# Patient Record
Sex: Female | Born: 1990 | Race: White | Hispanic: No | Marital: Married | Smoking: Never smoker
Health system: Southern US, Community
[De-identification: ages and names within clinical notes are randomized; demographics above are authoritative.]

## PROBLEM LIST (undated history)

## (undated) DIAGNOSIS — R55 Syncope and collapse: Secondary | ICD-10-CM

## (undated) HISTORY — DX: Syncope and collapse: R55

---

## 2016-11-15 ENCOUNTER — Emergency Department (HOSPITAL_BASED_OUTPATIENT_CLINIC_OR_DEPARTMENT_OTHER): Payer: Worker's Compensation

## 2016-11-15 ENCOUNTER — Emergency Department (HOSPITAL_BASED_OUTPATIENT_CLINIC_OR_DEPARTMENT_OTHER)
Admission: EM | Admit: 2016-11-15 | Discharge: 2016-11-15 | Disposition: A | Discharge status: Home / Self Care | Payer: Worker's Compensation | Attending: Emergency Medicine | Admitting: Emergency Medicine

## 2016-11-15 ENCOUNTER — Encounter (HOSPITAL_BASED_OUTPATIENT_CLINIC_OR_DEPARTMENT_OTHER): Payer: Self-pay | Admitting: Emergency Medicine

## 2016-11-15 DIAGNOSIS — S93401A Sprain of unspecified ligament of right ankle, initial encounter: Secondary | ICD-10-CM

## 2016-11-15 DIAGNOSIS — Y9301 Activity, walking, marching and hiking: Secondary | ICD-10-CM | POA: Insufficient documentation

## 2016-11-15 DIAGNOSIS — M25571 Pain in right ankle and joints of right foot: Secondary | ICD-10-CM

## 2016-11-15 DIAGNOSIS — X58XXXA Exposure to other specified factors, initial encounter: Secondary | ICD-10-CM | POA: Diagnosis not present

## 2016-11-15 DIAGNOSIS — Y929 Unspecified place or not applicable: Secondary | ICD-10-CM | POA: Diagnosis not present

## 2016-11-15 DIAGNOSIS — M79671 Pain in right foot: Secondary | ICD-10-CM

## 2016-11-15 DIAGNOSIS — Y999 Unspecified external cause status: Secondary | ICD-10-CM | POA: Diagnosis not present

## 2016-11-15 NOTE — Discharge Instructions (Signed)
Please take Ibuprofen (Advil, motrin) and Tylenol (acetaminophen) to relieve your pain.  You may take up to 800 MG (4 pills) of normal strength ibuprofen every 8 hours as needed.  In between doses of ibuprofen you make take tylenol, up to 1,000 mg (two extra strength pills).  Do not take more than 4,000 mg tylenol in a 24 hour period.  Please check all medication labels as many medications such as pain and cold medications may contain tylenol.  Do not drink alcohol while taking these medications.  Do not take other NSAID'S while taking ibuprofen (such as aleve or naproxen).  Please take ibuprofen with food to decrease stomach upset.

## 2016-11-15 NOTE — ED Provider Notes (Signed)
[facility] Provider Note   CSN: [id number] Arrival date & time: 11/15/16  1554  By signing my name below, I, Modena Jansky, attest that this documentation has been prepared under the direction and in the presence of non-physician practitioner, Lyndel Safe, PA-C. Electronically Signed: Modena Jansky, Scribe. 11/15/2016. 4:51 PM.  History   Chief Complaint Chief Complaint  Patient presents with  . Foot Pain   The history is provided by the patient. No language interpreter was used.   HPI Comments: Geneen Dieter is a 26 y.o. female who presents to the Emergency Department complaining of constant moderate right foot pain that started yesterday. She had a sudden onset of pain after stepping in a hole and everting her right foot. She elevated, iced, and applied compression to her foot PTA with some relief. Her pain is exacerbated by ambulation. She reports associated swelling. Denies any other complaints at this time.   No PCP  History reviewed. No pertinent past medical history.  There are no active problems to display for this patient.   Past Surgical History:  Procedure Laterality Date  . CESAREAN SECTION      OB History    No data available       Home Medications    Prior to Admission medications   Not on File    Family History No family history on file.  Social History Social History  Substance Use Topics  . Smoking status: Never Smoker  . Smokeless tobacco: Never Used  . Alcohol use No     Allergies   Patient has no known allergies.   Review of Systems Review of Systems  Musculoskeletal: Positive for joint swelling and myalgias.  Neurological: Negative for numbness.     Physical Exam Updated Vital Signs BP 95/76 (BP Location: Right Arm)   Pulse 76   Temp 98 F (36.7 C) (Oral)   Resp 16   Ht 5\' 10"  (1.778 m)   Wt 213 lb (96.6 kg)   SpO2 97%   BMI 30.56 kg/m   Physical Exam  Constitutional: She appears well-developed and  well-nourished. No distress.  HENT:  Head: Normocephalic.  Pulmonary/Chest: Effort normal.  Musculoskeletal:  Obvious swelling to the right lateral foot/ankle. No TTP of lateral or medial malleolus. No TTP of the head of the 5th metatarsal. Ankle is grossly stable with intact sensation, motor, and circulatory function.   Neurological: She is alert.  Psychiatric: She has a normal mood and affect.  Nursing note and vitals reviewed.    ED Treatments / Results  DIAGNOSTIC STUDIES: Oxygen Saturation is 97% on RA, normal by my interpretation.    COORDINATION OF CARE: 4:55 PM- Pt advised of plan for treatment and pt agrees.  Labs (all labs ordered are listed, but only abnormal results are displayed) Labs Reviewed - No data to display  EKG  EKG Interpretation None       Radiology Dg Foot Complete Right  Result Date: 11/15/2016 CLINICAL DATA:  Twisting injury with pain and swelling EXAM: RIGHT FOOT COMPLETE - 3+ VIEW COMPARISON:  None. FINDINGS: Frontal, oblique, and lateral views were obtained. There is no fracture or dislocation. Joint spaces appear normal. No erosive change. IMPRESSION: No fracture or dislocation.  No appreciable arthropathic change. Electronically Signed   By: Bretta Bang III M.D.   On: 11/15/2016 16:40    Procedures Procedures (including critical care time)  Medications Ordered in ED Medications - No data to display   Initial Impression / Assessment and  Plan / ED Course  I have reviewed the triage vital signs and the nursing notes.  Pertinent labs & imaging results that were available during my care of the patient were reviewed by me and considered in my medical decision making (see chart for details).     Patient X-Ray negative for obvious fracture or dislocation. Pain managed in ED. Pt advised to follow up with podiatry if symptoms persist for possibility of missed fracture diagnosis. Patient given Aircast, ace wrap while in ED, conservative  therapy recommended and discussed. Patient offered crutches but declined.  Patient will be dc home & is agreeable with above plan.  Final Clinical Impressions(s) / ED Diagnoses   Final diagnoses:  Sprain of right ankle, unspecified ligament, initial encounter  Right foot pain  Acute right ankle pain    New Prescriptions There are no discharge medications for this patient.  I personally performed the services described in this documentation, which was scribed in my presence. The recorded information has been reviewed and is accurate.    Cristina GongHammond, Jamelle Noy W, PA-C 11/16/16 1325    Shaune PollackIsaacs, Cameron, MD 11/16/16 1404

## 2016-11-15 NOTE — ED Triage Notes (Signed)
R foot pain after stepping in a hole yesterday. Swelling noted.

## 2018-04-29 ENCOUNTER — Emergency Department (HOSPITAL_BASED_OUTPATIENT_CLINIC_OR_DEPARTMENT_OTHER)
Admission: EM | Admit: 2018-04-29 | Discharge: 2018-04-29 | Disposition: A | Discharge status: Home / Self Care | Payer: BC Managed Care – PPO | Attending: Emergency Medicine | Admitting: Emergency Medicine

## 2018-04-29 ENCOUNTER — Encounter (HOSPITAL_BASED_OUTPATIENT_CLINIC_OR_DEPARTMENT_OTHER): Payer: Self-pay | Admitting: *Deleted

## 2018-04-29 ENCOUNTER — Other Ambulatory Visit: Payer: Self-pay

## 2018-04-29 ENCOUNTER — Emergency Department (HOSPITAL_BASED_OUTPATIENT_CLINIC_OR_DEPARTMENT_OTHER): Payer: BC Managed Care – PPO

## 2018-04-29 DIAGNOSIS — R55 Syncope and collapse: Secondary | ICD-10-CM | POA: Diagnosis present

## 2018-04-29 LAB — URINALYSIS, MICROSCOPIC (REFLEX)

## 2018-04-29 LAB — CBC WITH DIFFERENTIAL/PLATELET
Abs Immature Granulocytes: 0.05 10*3/uL (ref 0.00–0.07)
Basophils Absolute: 0.1 10*3/uL (ref 0.0–0.1)
Basophils Relative: 0 %
EOS ABS: 0.2 10*3/uL (ref 0.0–0.5)
Eosinophils Relative: 1 %
HCT: 43 % (ref 36.0–46.0)
Hemoglobin: 13.7 g/dL (ref 12.0–15.0)
Immature Granulocytes: 0 %
Lymphocytes Relative: 16 %
Lymphs Abs: 1.9 10*3/uL (ref 0.7–4.0)
MCH: 28.5 pg (ref 26.0–34.0)
MCHC: 31.9 g/dL (ref 30.0–36.0)
MCV: 89.4 fL (ref 80.0–100.0)
MONO ABS: 0.7 10*3/uL (ref 0.1–1.0)
MONOS PCT: 6 %
Neutro Abs: 9.2 10*3/uL — ABNORMAL HIGH (ref 1.7–7.7)
Neutrophils Relative %: 77 %
PLATELETS: 267 10*3/uL (ref 150–400)
RBC: 4.81 MIL/uL (ref 3.87–5.11)
RDW: 11.8 % (ref 11.5–15.5)
WBC: 12.1 10*3/uL — ABNORMAL HIGH (ref 4.0–10.5)
nRBC: 0 % (ref 0.0–0.2)

## 2018-04-29 LAB — URINALYSIS, ROUTINE W REFLEX MICROSCOPIC
BILIRUBIN URINE: NEGATIVE
Glucose, UA: NEGATIVE mg/dL
Ketones, ur: NEGATIVE mg/dL
Leukocytes, UA: NEGATIVE
NITRITE: NEGATIVE
PROTEIN: NEGATIVE mg/dL
Specific Gravity, Urine: >1.03 — ABNORMAL HIGH (ref 1.005–1.030)
pH: 5 (ref 5.0–8.0)

## 2018-04-29 LAB — BASIC METABOLIC PANEL
Anion gap: 7 (ref 5–15)
BUN: 23 mg/dL — AB (ref 6–20)
CALCIUM: 8.9 mg/dL (ref 8.9–10.3)
CO2: 23 mmol/L (ref 22–32)
CREATININE: 0.78 mg/dL (ref 0.44–1.00)
Chloride: 106 mmol/L (ref 98–111)
GFR calc Af Amer: >60 mL/min (ref >60)
Glucose, Bld: 107 mg/dL — ABNORMAL HIGH (ref 70–99)
Potassium: 3.5 mmol/L (ref 3.5–5.1)
Sodium: 136 mmol/L (ref 135–145)

## 2018-04-29 LAB — PREGNANCY, URINE: Preg Test, Ur: NEGATIVE

## 2018-04-29 LAB — CBG MONITORING, ED: Glucose-Capillary: 98 mg/dL (ref 70–99)

## 2018-04-29 MED ORDER — SODIUM CHLORIDE 0.9 % IV BOLUS
1000.0000 mL | Freq: Once | INTRAVENOUS | Status: AC
  Administered 2018-04-29: 1000 mL via INTRAVENOUS

## 2018-04-29 NOTE — ED Provider Notes (Signed)
[facility] Provider Note: Lowella Dell, MD, FACEP  CSN: [id number] MRN: [id number] ARRIVAL: 04/29/18 at 0038 ROOM: [facility]   CHIEF COMPLAINT  Syncope   HISTORY OF PRESENT ILLNESS  04/29/18 1:00 AM Natalie Hardin is a 27 y.o. female without significant past medical history.  She was bundled up at home because she felt cold.  While tending to a sick daughter she felt nauseated but had no other symptoms.  Her husband observed her slumped to the bed, slide off the bed onto the floor and then had generalized shaking for 5 to 10 seconds ("it was very brief").  There was no urinary or fecal incontinence.  She did not bite her tongue.  When she came to she was confused for several minutes, then returned to baseline.  She denies injury.  She is asymptomatic presently.  She is not taking any prescription, over-the-counter, or illicit drugs.   History reviewed. No pertinent past medical history.  Past Surgical History:  Procedure Laterality Date  . CESAREAN SECTION      No family history on file.  Social History   Tobacco Use  . Smoking status: Never Smoker  . Smokeless tobacco: Never Used  Substance Use Topics  . Alcohol use: No  . Drug use: No    Prior to Admission medications   Not on File    Allergies Patient has no known allergies.   REVIEW OF SYSTEMS  Negative except as noted here or in the History of Present Illness.   PHYSICAL EXAMINATION  Initial Vital Signs Blood pressure (!) 142/86, pulse 80, temperature 97.8 F (36.6 C), temperature source Oral, resp. rate 16, height 5\' 9"  (1.753 m), weight 95.3 kg, SpO2 100 %.  Examination General: Well-developed, well-nourished female in no acute distress; appearance consistent with age of record HENT: normocephalic; atraumatic; no bite marks on tongue Eyes: pupils equal, round and reactive to light; extraocular muscles intact Neck: supple Heart: regular rate and rhythm Lungs: clear to auscultation  bilaterally Abdomen: soft; nondistended; nontender; no masses or hepatosplenomegaly; bowel sounds present Extremities: No deformity; full range of motion; pulses normal Neurologic: Awake, alert and oriented; motor function intact in all extremities and symmetric; no facial droop; negative Romberg; normal finger-to-nose; normal coordination and speech Skin: Warm and dry Psychiatric: Normal mood and affect   RESULTS  Summary of this visit's results, reviewed by myself:   EKG Interpretation  Date/Time:  Sunday April 29 2018 01:01:01 EST Ventricular Rate:  78 PR Interval:    QRS Duration: 109 QT Interval:  376 QTC Calculation: 429 R Axis:   59 Text Interpretation:  Sinus rhythm RSR' in V1 or V2, right VCD or RVH Borderline T abnormalities, anterior leads Baseline wander in lead(s) V6 No previous ECGs available Confirmed by Paula Libra ([id number]) on 04/29/2018 1:10:38 AM      Laboratory Studies: Results for orders placed or performed during the hospital encounter of 04/29/18 (from the past 24 hour(s))  CBG monitoring, ED     Status: None   Collection Time: 04/29/18  1:11 AM  Result Value Ref Range   Glucose-Capillary 98 70 - 99 mg/dL  CBC with Differential/Platelet     Status: Abnormal   Collection Time: 04/29/18  1:43 AM  Result Value Ref Range   WBC 12.1 (H) 4.0 - 10.5 K/uL   RBC 4.81 3.87 - 5.11 MIL/uL   Hemoglobin 13.7 12.0 - 15.0 g/dL   HCT 62.1 30.8 - 65.7 %   MCV 89.4 80.0 - 100.0  fL   MCH 28.5 26.0 - 34.0 pg   MCHC 31.9 30.0 - 36.0 g/dL   RDW 16.1 09.6 - 04.5 %   Platelets 267 150 - 400 K/uL   nRBC 0.0 0.0 - 0.2 %   Neutrophils Relative % 77 %   Neutro Abs 9.2 (H) 1.7 - 7.7 K/uL   Lymphocytes Relative 16 %   Lymphs Abs 1.9 0.7 - 4.0 K/uL   Monocytes Relative 6 %   Monocytes Absolute 0.7 0.1 - 1.0 K/uL   Eosinophils Relative 1 %   Eosinophils Absolute 0.2 0.0 - 0.5 K/uL   Basophils Relative 0 %   Basophils Absolute 0.1 0.0 - 0.1 K/uL   Immature Granulocytes 0 %    Abs Immature Granulocytes 0.05 0.00 - 0.07 K/uL  Basic metabolic panel     Status: Abnormal   Collection Time: 04/29/18  1:43 AM  Result Value Ref Range   Sodium 136 135 - 145 mmol/L   Potassium 3.5 3.5 - 5.1 mmol/L   Chloride 106 98 - 111 mmol/L   CO2 23 22 - 32 mmol/L   Glucose, Bld 107 (H) 70 - 99 mg/dL   BUN 23 (H) 6 - 20 mg/dL   Creatinine, Ser 4.09 0.44 - 1.00 mg/dL   Calcium 8.9 8.9 - 81.1 mg/dL   GFR calc non Af Amer >60 >60 mL/min   GFR calc Af Amer >60 >60 mL/min   Anion gap 7 5 - 15  Urinalysis, Routine w reflex microscopic     Status: Abnormal   Collection Time: 04/29/18  1:54 AM  Result Value Ref Range   Color, Urine YELLOW YELLOW   APPearance CLEAR CLEAR   Specific Gravity, Urine >1.030 (H) 1.005 - 1.030   pH 5.0 5.0 - 8.0   Glucose, UA NEGATIVE NEGATIVE mg/dL   Hgb urine dipstick SMALL (A) NEGATIVE   Bilirubin Urine NEGATIVE NEGATIVE   Ketones, ur NEGATIVE NEGATIVE mg/dL   Protein, ur NEGATIVE NEGATIVE mg/dL   Nitrite NEGATIVE NEGATIVE   Leukocytes, UA NEGATIVE NEGATIVE  Pregnancy, urine     Status: None   Collection Time: 04/29/18  1:54 AM  Result Value Ref Range   Preg Test, Ur NEGATIVE NEGATIVE  Urinalysis, Microscopic (reflex)     Status: Abnormal   Collection Time: 04/29/18  1:54 AM  Result Value Ref Range   RBC / HPF 6-10 0 - 5 RBC/hpf   WBC, UA 0-5 0 - 5 WBC/hpf   Bacteria, UA FEW (A) NONE SEEN   Squamous Epithelial / LPF 0-5 0 - 5   Mucus PRESENT    Imaging Studies: Ct Head Wo Contrast  Result Date: 04/29/2018 CLINICAL DATA:  New nontraumatic seizure. EXAM: CT HEAD WITHOUT CONTRAST TECHNIQUE: Contiguous axial images were obtained from the base of the skull through the vertex without intravenous contrast. COMPARISON:  None. FINDINGS: Brain: No evidence of acute infarction, hemorrhage, hydrocephalus, extra-axial collection or mass lesion/mass effect. Vascular: No hyperdense vessel or unexpected calcification. Skull: Calvarium appears intact.  Sinuses/Orbits: Mild mucosal thickening in the paranasal sinuses. No acute air-fluid levels. Mastoid air cells are clear. Other: None. IMPRESSION: No acute intracranial abnormalities. Electronically Signed   By: Burman Nieves M.D.   On: 04/29/2018 01:39    ED COURSE and MDM  Nursing notes and initial vitals signs, including pulse oximetry, reviewed.  Vitals:   04/29/18 0134 04/29/18 0200 04/29/18 0230 04/29/18 0245  BP: 120/75 109/81 113/76 105/70  Pulse: 73 69 73 67  Resp:  Temp:      TempSrc:      SpO2: 99% 99% 100% 98%  Weight:      Height:       3:00 AM Patient given normal saline bolus 1 L as urinalysis specific gravity suggests dehydration.  It is unclear if this represents simple syncope with seizure-like activity or an actual seizure.  Patient was advised not to drive or operate machinery pending follow-up with neurology.  We will refer to neurology for further work-up.  She does not have a primary care physician.  PROCEDURES    ED DIAGNOSES     ICD-10-CM   1. Brief loss of consciousness R55        Delmos Velaquez, MD 04/29/18 0301

## 2018-04-29 NOTE — ED Triage Notes (Signed)
Pt's husband reports pt had a 5-10 second episode of shaking following a brief LOC around 1130pm. States she was standing at end of bed then slumped over and slid onto floor and convulsed briefly. Reports pt was confused for a few minutes after the episode. Denies Hx of seizures. Pt cao x 4 at present

## 2018-05-07 ENCOUNTER — Ambulatory Visit: Payer: BC Managed Care – PPO | Admitting: Neurology

## 2018-05-07 ENCOUNTER — Encounter: Payer: Self-pay | Admitting: Neurology

## 2018-05-07 VITALS — BP 149/97 | HR 76 | Ht 69.0 in | Wt 218.0 lb

## 2018-05-07 DIAGNOSIS — R252 Cramp and spasm: Secondary | ICD-10-CM

## 2018-05-07 DIAGNOSIS — R55 Syncope and collapse: Secondary | ICD-10-CM | POA: Diagnosis not present

## 2018-05-07 NOTE — Progress Notes (Signed)
[facility] [address] [place]. [place] [address] [phone]       OFFICE CONSULT NOTE  Ms. Natalie Hardin Date of Birth:  Apr 27, 1991 Medical Record Number:  [id number]   Referring MD: Paula Libra Reason for Referral: Syncope versus seizure  HPI: Natalie Hardin is a pleasant 27 year old Caucasian lady seen today for initial office consultation visit.  She is accompanied by her husband.  History is obtained from them and review of electronic medical records.  I personally reviewed imaging films.  The patient woke up from sleep on 04/29/2018 as her daughter was sick and was throwing up.  She stood up to get out of bed and slumped forward face down and split to the edge and fell on the ground.  She regained consciousness only after she was on the ground and remained dazed and disoriented for a few minutes.  The husband was eyewitness noticed that her eyes were closed.  There is no tongue bite urinary incontinence noted.  She did have some minor jerking and trembling of her upper extremities which lasted 3 to 4 minutes only.  Patient was back to her normal self for than 5 minutes.  She denies any prior history of syncope or passing out but does admit to episodes where she has felt lightheaded and faint and needed to sit down and was sweaty but did not completely pass out.  She does have a history of underlying anxiety.  She underwent CT scan of the head in the emergency room which was unremarkable and I have personally reviewed imaging films.  She was felt to be slightly dehydrated.  She has no family history of seizures.  She is otherwise quite healthy and does not take any significant medications.  She has an IUD but does not take any birth control pills.  She denies history of migraine headaches seizures strokes or TIAs or significant head injury with loss of consciousness. ROS:   14 system review of systems is positive for fatigue, feeling hot and cold, increased thirst, headache,  passing out, anxiety, sleepiness and all systems negative PMH: History reviewed. No pertinent past medical history.  Social History:  Social History   Socioeconomic History  . Marital status: Married    Spouse name: Not on file  . Number of children: Not on file  . Years of education: Not on file  . Highest education level: Not on file  Occupational History  . Not on file  Social Needs  . Financial resource strain: Not on file  . Food insecurity:    Worry: Not on file    Inability: Not on file  . Transportation needs:    Medical: Not on file    Non-medical: Not on file  Tobacco Use  . Smoking status: Never Smoker  . Smokeless tobacco: Never Used  Substance and Sexual Activity  . Alcohol use: No  . Drug use: No  . Sexual activity: Yes    Birth control/protection: Inserts  Lifestyle  . Physical activity:    Days per week: Not on file    Minutes per session: Not on file  . Stress: Not on file  Relationships  . Social connections:    Talks on phone: Not on file    Gets together: Not on file    Attends religious service: Not on file    Active member of club or organization: Not on file    Attends meetings of clubs or organizations: Not on file    Relationship  status: Not on file  . Intimate partner violence:    Fear of current or ex partner: Not on file    Emotionally abused: Not on file    Physically abused: Not on file    Forced sexual activity: Not on file  Other Topics Concern  . Not on file  Social History Narrative  . Not on file    Medications:   Current Outpatient Medications on File Prior to Visit  Medication Sig Dispense Refill  . acetaminophen (TYLENOL) 325 MG suppository Place rectally.    Marland Kitchen. ibuprofen (ADVIL,MOTRIN) 600 MG tablet Take by mouth.    . levonorgestrel (MIRENA) 20 MCG/24HR IUD by Intrauterine route.     No current facility-administered medications on file prior to visit.     Allergies:  No Known Allergies  Physical Exam General:  Mildly obese young Caucasian lady, seated, in no evident distress Head: head normocephalic and atraumatic.   Neck: supple with no carotid or supraclavicular bruits Cardiovascular: regular rate and rhythm, no murmurs Musculoskeletal: no deformity Skin:  no rash/petichiae Vascular:  Normal pulses all extremities  Neurologic Exam Mental Status: Awake and fully alert. Oriented to place and time. Recent and remote memory intact. Attention span, concentration and fund of knowledge appropriate. Mood and affect appropriate.  Cranial Nerves: Fundoscopic exam reveals sharp disc margins. Pupils equal, briskly reactive to light. Extraocular movements full without nystagmus. Visual fields full to confrontation. Hearing intact. Facial sensation intact. Face, tongue, palate moves normally and symmetrically.  Motor: Normal bulk and tone. Normal strength in all tested extremity muscles. Sensory.: intact to touch , pinprick , position and vibratory sensation.  Coordination: Rapid alternating movements normal in all extremities. Finger-to-nose and heel-to-shin performed accurately bilaterally. Gait and Station: Arises from chair without difficulty. Stance is normal. Gait demonstrates normal stride length and balance . Able to heel, toe and tandem walk without difficulty.  Reflexes: 1+ and symmetric. Toes downgoing.      ASSESSMENT: 27 year old Caucasian lady with solitary episode of brief loss of consciousness followed by an mild jerking possibly convulsive syncope versus seizure.    PLAN: I had a long discussion with the patient and her husband regarding her episode of brief loss of consciousness and jerking likely represent representing convulsive syncope.  Seizure and vertebrobasilar TIA less likely but need evaluation for.  I recommend recheck EEG, transthoracic echo, 30-day heart monitor, MRI of the brain and MRA of the brain and neck.  She was advised to keep her self well-hydrated and not to drive until  about tests were completed.  Greater than 50% time during this 45-minute consultation visit was spent on counseling and coordination of care about syncope, seizure discussion about required testing and answered questions she will return for follow-up in 2 months or call earlier if necessary.  Delia HeadyPramod Sethi, MD  [facility] [address] [place], [place] [id number]  Phone [phone] Fax [phone] Note: This document was prepared with digital dictation and possible smart phrase technology. Any transcriptional errors that result from this process are unintentional.

## 2018-05-07 NOTE — Patient Instructions (Addendum)
I had a long discussion with the patient and her husband regarding her episode of brief loss of consciousness and jerking likely represent representing convulsive syncope.  Seizure and vertebrobasilar TIA less likely but need evaluation for.  I recommend recheck EEG, transthoracic echo, 30-day heart monitor, MRI of the brain and MRA of the brain and neck.  She was advised to keep her self well-hydrated and not to drive ~about tests were completed.  She will return for follow-up in 2 months or call earlier if necessary.   Syncope Syncope is when you lose temporarily pass out (faint). Signs that you may be about to pass out include:  Feeling dizzy or light-headed.  Feeling sick to your stomach (nauseous).  Seeing all white or all black.  Having cold, clammy skin.  If you passed out, get help right away. Call your local emergency services (911 in the [place] Do not drive yourself to the hospital. Follow these instructions at home: Pay attention to any changes in your symptoms. Take these actions to help with your condition:  Have someone stay with you until you feel stable.  Do not drive, use machinery, or play sports until your doctor says it is okay.  Keep all follow-up visits as told by your doctor. This is important.  If you start to feel like you might pass out, lie down right away and raise (elevate) your feet above the level of your heart. Breathe deeply and steadily. Wait until all of the symptoms are gone.  Drink enough fluid to keep your pee (urine) clear or pale yellow.  If you are taking blood pressure or heart medicine, get up slowly and spend many minutes getting ready to sit and then stand. This can help with dizziness.  Take over-the-counter and prescription medicines only as told by your doctor.  Get help right away if:  You have a very bad headache.  You have unusual pain in your chest, tummy, or back.  You are bleeding from your mouth or rectum.  You have black  or tarry poop (stool).  You have a very fast or uneven heartbeat (palpitations).  It hurts to breathe.  You pass out once or more than once.  You have jerky movements that you cannot control (seizure).  You are confused.  You have trouble walking.  You are very weak.  You have vision problems. These symptoms may be an emergency. Do not wait to see if the symptoms will go away. Get medical help right away. Call your local emergency services (911 in the [place] Do not drive yourself to the hospital. This information is not intended to replace advice given to you by your health care provider. Make sure you discuss any questions you have with your health care provider. Document Released: 11/09/2007 Document Revised: 10/29/2015 Document Reviewed: 02/04/2015 Elsevier Interactive Patient Education  [employer]2018 [employer]

## 2018-05-08 ENCOUNTER — Telehealth: Payer: Self-pay | Admitting: Neurology

## 2018-05-08 NOTE — Telephone Encounter (Signed)
lvm for pt to call back about scheduling mri  [employer] Auth: [id number] (exp. 05/08/18 to 06/06/18)

## 2018-05-08 NOTE — Telephone Encounter (Signed)
I spoke to the patient she is [facility].

## 2018-05-09 ENCOUNTER — Ambulatory Visit (INDEPENDENT_AMBULATORY_CARE_PROVIDER_SITE_OTHER): Payer: BC Managed Care – PPO | Admitting: Neurology

## 2018-05-09 ENCOUNTER — Other Ambulatory Visit: Payer: Self-pay

## 2018-05-09 ENCOUNTER — Ambulatory Visit (HOSPITAL_COMMUNITY): Payer: BC Managed Care – PPO | Attending: Cardiology

## 2018-05-09 DIAGNOSIS — R252 Cramp and spasm: Secondary | ICD-10-CM

## 2018-05-09 DIAGNOSIS — R55 Syncope and collapse: Secondary | ICD-10-CM

## 2018-05-21 ENCOUNTER — Telehealth: Payer: Self-pay

## 2018-05-21 MED ORDER — ALPRAZOLAM 0.5 MG PO TABS: 0.5000 mg | ORAL_TABLET | Freq: Two times a day (BID) | ORAL | 0 refills | Status: DC | PRN

## 2018-05-21 NOTE — Addendum Note (Signed)
Addended by: Hildred AlaminMURRELL, KATRINA Y on: 05/21/2018 11:59 AM   Modules accepted: Orders

## 2018-05-21 NOTE — Telephone Encounter (Signed)
RN call patients husband that pts EEG and Echocardiogram was normal.He verbalized understanding.

## 2018-05-21 NOTE — Telephone Encounter (Signed)
Spoke to patient to remind her of her appt for Wednesday she informed me she would like to take something to help with her claustrophobic.

## 2018-05-21 NOTE — Telephone Encounter (Signed)
RN call pts husband that xanax can be sent if he is driving her to the testing site.RN stated pt cannot drive the day of testing if she needs the xanax.The husband stated he will be driving her to the testing site. RN stated xanax will be sent to the pharmacy.

## 2018-05-21 NOTE — Telephone Encounter (Signed)
-----   Message from Micki RileyPramod S Sethi, MD sent at 05/18/2018  6:32 PM EST ----- Kindly inform the patient that echocardiogram study was normal.

## 2018-05-21 NOTE — Telephone Encounter (Signed)
Xanax sent to pts pharmacy for MRI testing.

## 2018-05-23 ENCOUNTER — Ambulatory Visit: Payer: BC Managed Care – PPO

## 2018-05-23 DIAGNOSIS — R55 Syncope and collapse: Secondary | ICD-10-CM | POA: Diagnosis not present

## 2018-05-23 DIAGNOSIS — R252 Cramp and spasm: Secondary | ICD-10-CM | POA: Diagnosis not present

## 2018-05-23 MED ORDER — GADOBENATE DIMEGLUMINE 529 MG/ML IV SOLN
20.0000 mL | Freq: Once | INTRAVENOUS | Status: AC | PRN
  Administered 2018-05-23: 20 mL via INTRAVENOUS

## 2018-06-08 ENCOUNTER — Telehealth: Payer: Self-pay

## 2018-06-08 NOTE — Telephone Encounter (Signed)
-----   Message from Micki Riley, MD sent at 06/08/2018 11:47 AM EST ----- Kindly inform the patient that MRI scan of the brain shows no significant abnormalities in the brain.  Incidental findings of mild paranasal sinus inflammation is noted.  MRA of the brain and neck did not reveal significant blockages of the large blood vessels in the brain or neck.  No worrisome finding noted.

## 2018-06-08 NOTE — Telephone Encounter (Signed)
Rn call patient about her 3 images results of MR brain,MRA head, MRA neck. Rn stated per Dr. Pearlean Brownie note MRI scan of the brain shows no significant abnormalities in the brain. Incidental findings of mild paranasal sinus inflammation is noted. MRA of the brain and neck did not reveal significant blockages of the large blood vessels in the brain or neck. No worrisome finding noted. The pt verbalized understanding. ------

## 2020-04-27 IMAGING — CT CT HEAD W/O CM
3 series · 15 of 47 positions shown, 18 images · non-contrast
Comparison: None.

CLINICAL DATA: New nontraumatic seizure.

EXAM:
CT HEAD WITHOUT CONTRAST
TECHNIQUE: Contiguous axial images were obtained from the base of the skull
through the vertex without intravenous contrast.

[Series 2: head wo · axial · 0.44mm/px · z∈[+1122,+1246]mm · 9 of 31 slices shown, 12 images]
[im 3/31  brain]
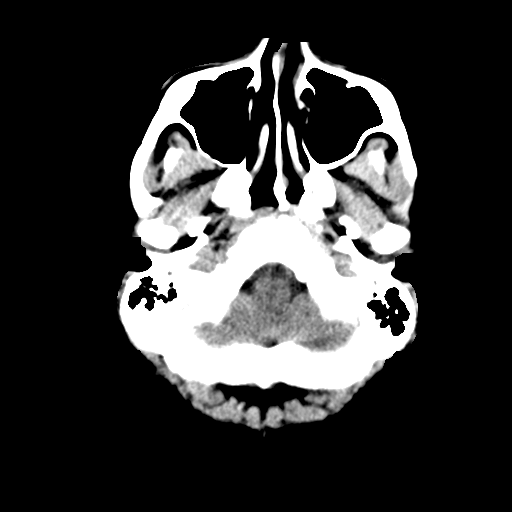
[im 3/31  bone]
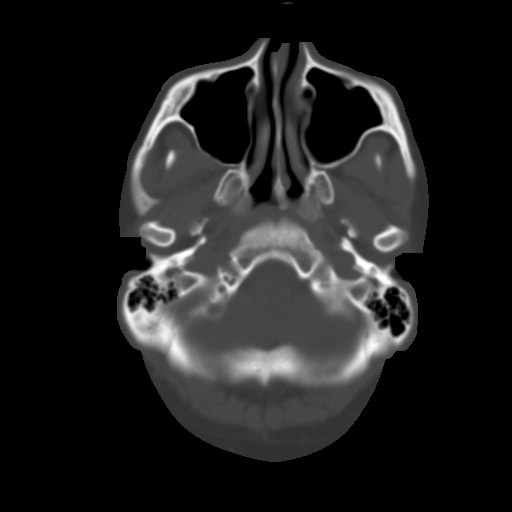
[im 6/31  brain]
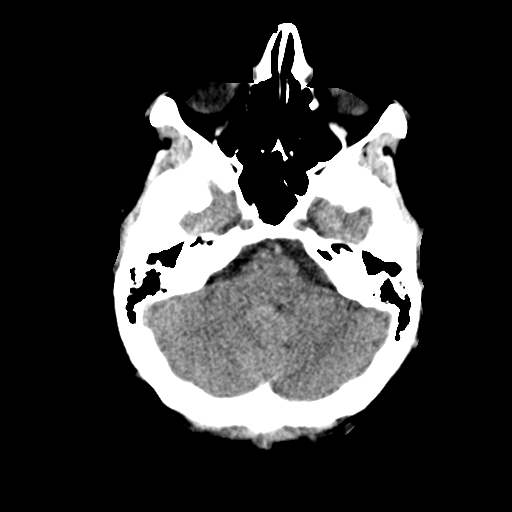
[im 9/31  brain]
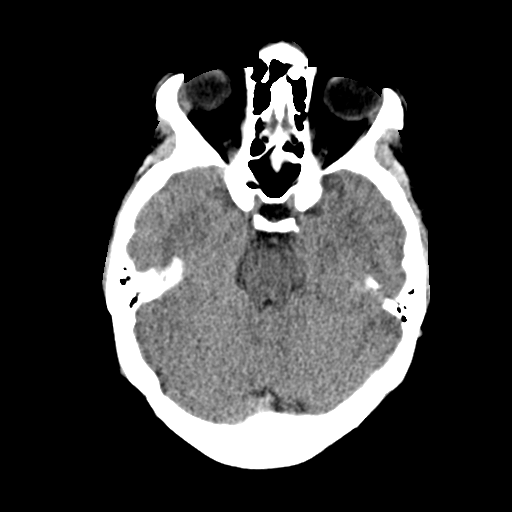
[im 12/31  brain]
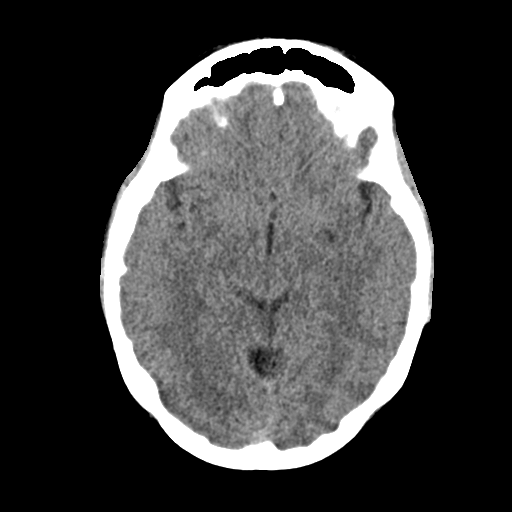
[im 16/31  brain]
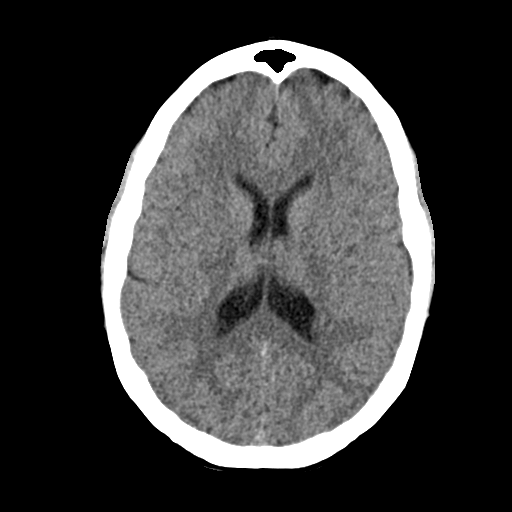
[im 16/31  bone]
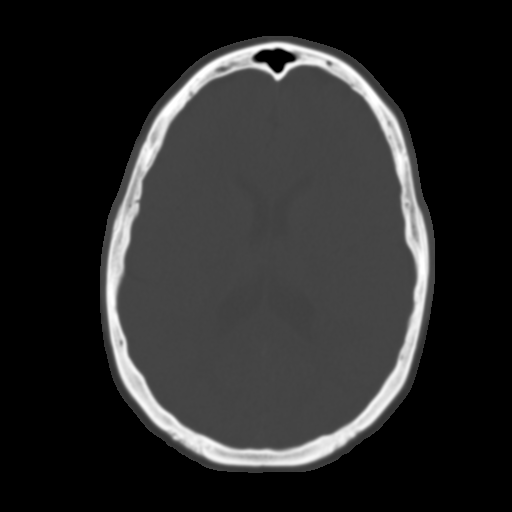
[im 19/31  brain]
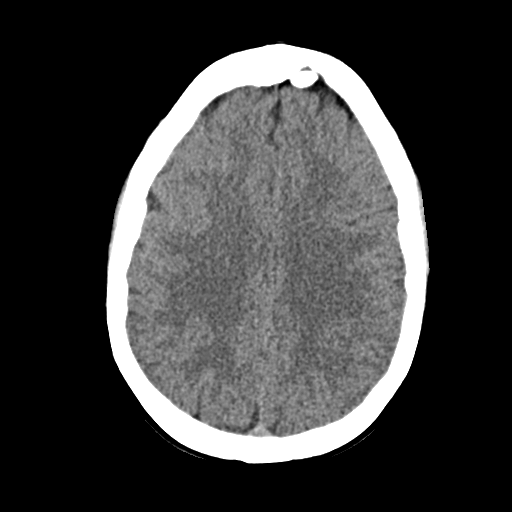
[im 22/31  brain]
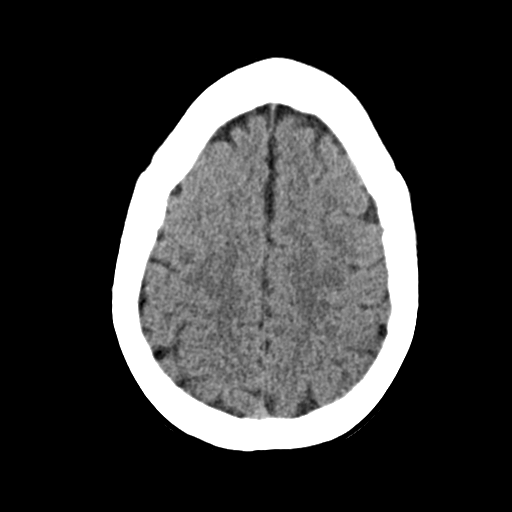
[im 25/31  brain]
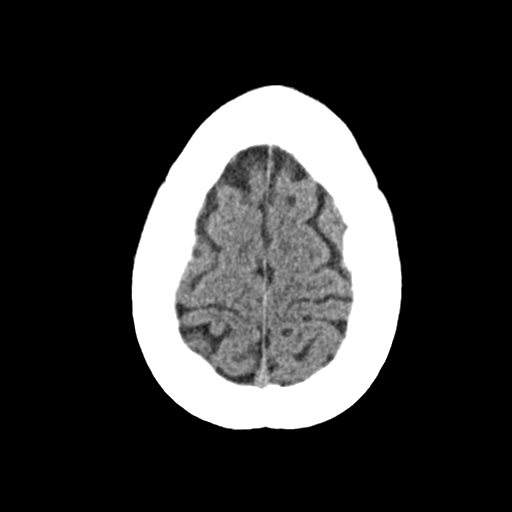
[im 28/31  brain]
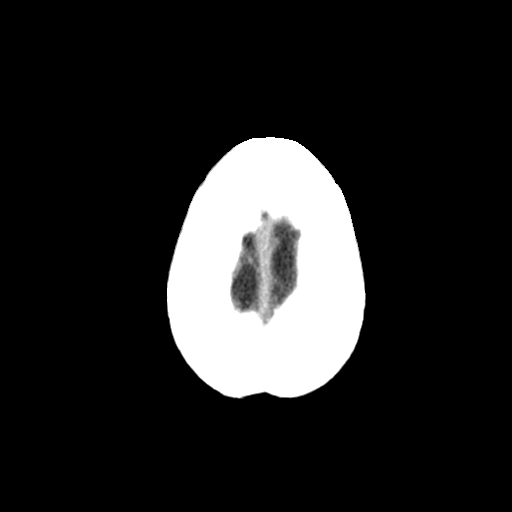
[im 28/31  bone]
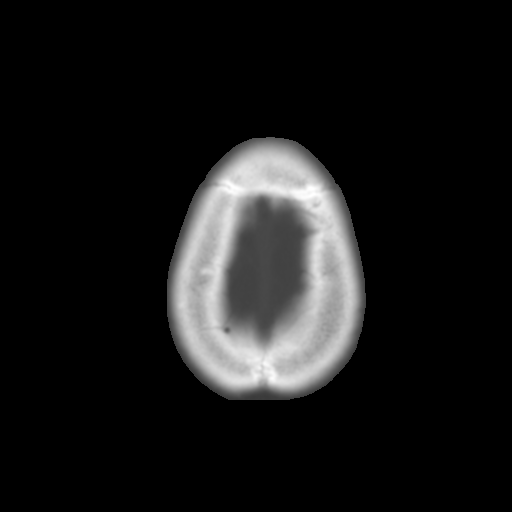

[Series 4: coronal soft · coronal · 0.33mm/px · 3 of 74 slices shown]
[im 25/74  brain]
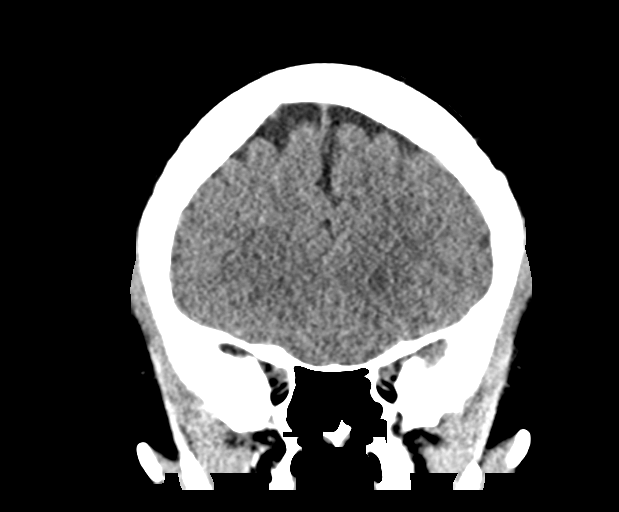
[im 33/74  brain]
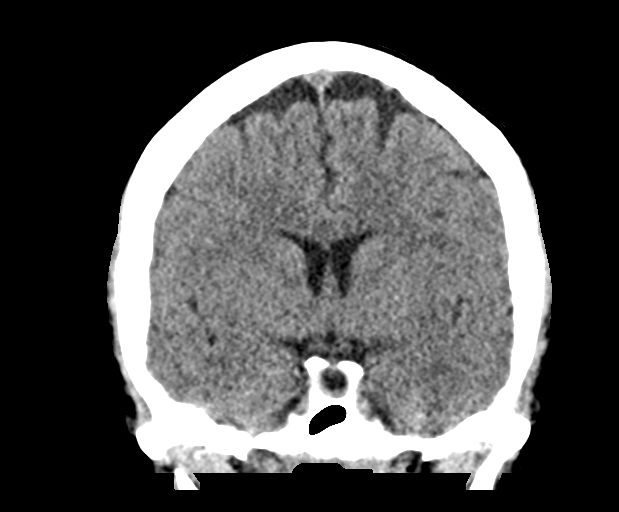
[im 41/74  brain]
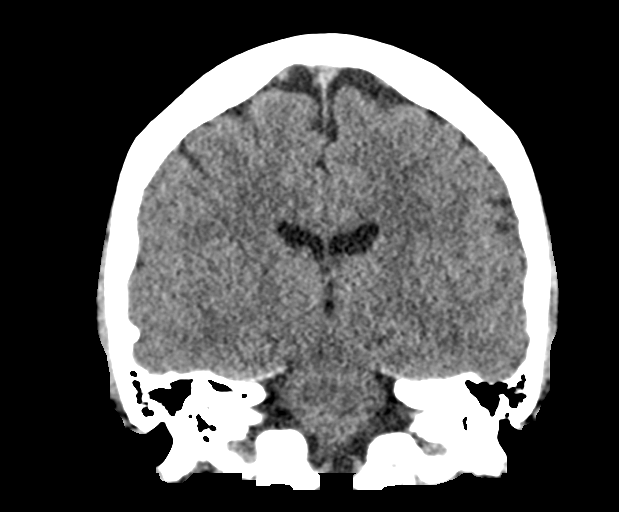

[Series 5: sag soft · sagittal · 0.30mm/px · 3 of 67 slices shown]
[im 23/67  brain]
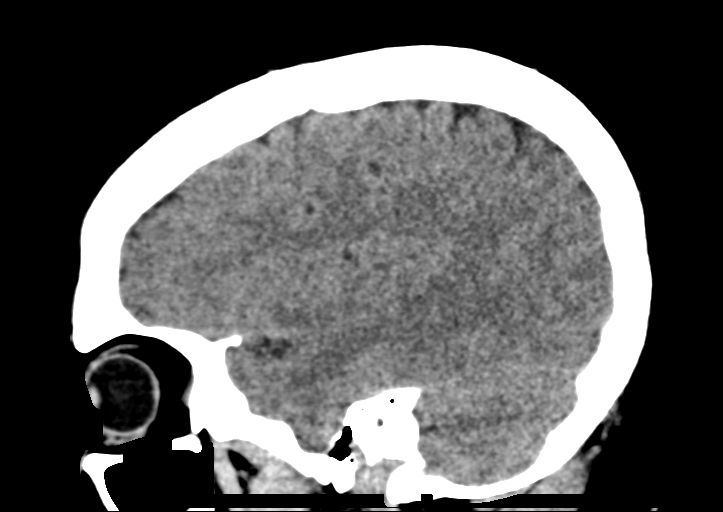
[im 34/67  brain]
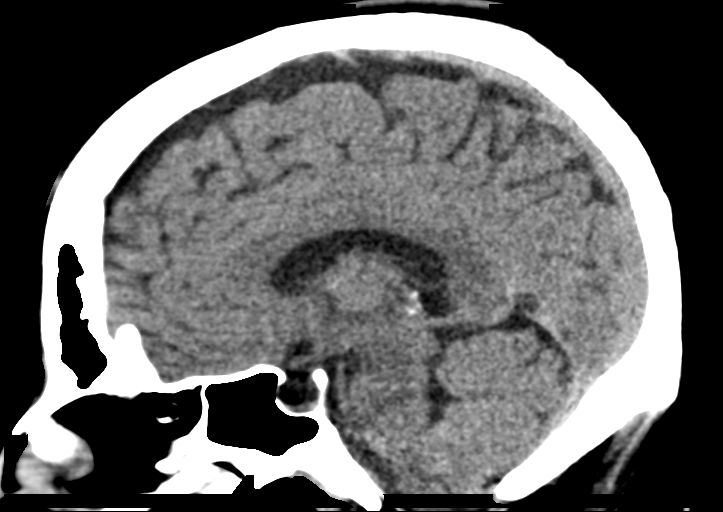
[im 45/67  brain]
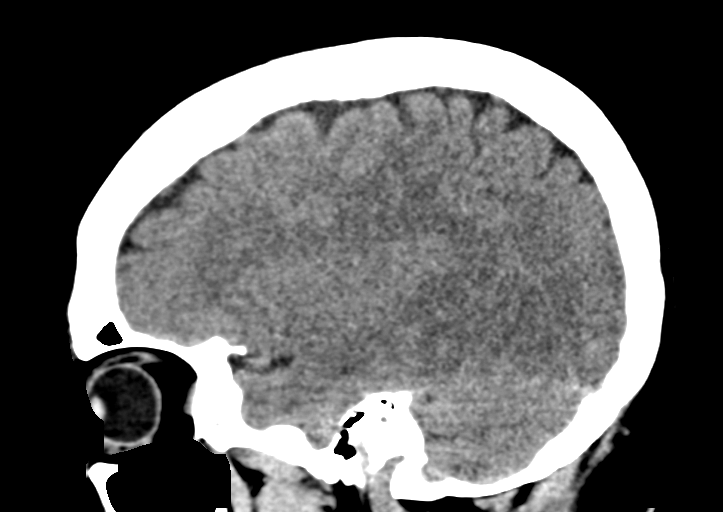

[15 of 47 positions shown; findings below may reference images not displayed]

FINDINGS: Brain: No evidence of acute infarction, hemorrhage, hydrocephalus,
extra-axial collection or mass lesion/mass effect.

Vascular: No hyperdense vessel or unexpected calcification.

Skull: Calvarium appears intact.

Sinuses/Orbits: Mild mucosal thickening in the paranasal sinuses. No
acute air-fluid levels. Mastoid air cells are clear.

Other: None.
IMPRESSION: No acute intracranial abnormalities.

## 2022-03-10 DIAGNOSIS — M9901 Segmental and somatic dysfunction of cervical region: Secondary | ICD-10-CM | POA: Diagnosis not present

## 2022-03-10 DIAGNOSIS — M5442 Lumbago with sciatica, left side: Secondary | ICD-10-CM | POA: Diagnosis not present

## 2022-03-10 DIAGNOSIS — M542 Cervicalgia: Secondary | ICD-10-CM | POA: Diagnosis not present

## 2022-03-10 DIAGNOSIS — M546 Pain in thoracic spine: Secondary | ICD-10-CM | POA: Diagnosis not present

## 2022-03-24 DIAGNOSIS — M5442 Lumbago with sciatica, left side: Secondary | ICD-10-CM | POA: Diagnosis not present

## 2022-03-24 DIAGNOSIS — M546 Pain in thoracic spine: Secondary | ICD-10-CM | POA: Diagnosis not present

## 2022-03-24 DIAGNOSIS — M9901 Segmental and somatic dysfunction of cervical region: Secondary | ICD-10-CM | POA: Diagnosis not present

## 2022-03-24 DIAGNOSIS — M542 Cervicalgia: Secondary | ICD-10-CM | POA: Diagnosis not present

## 2022-04-07 DIAGNOSIS — M9901 Segmental and somatic dysfunction of cervical region: Secondary | ICD-10-CM | POA: Diagnosis not present

## 2022-04-07 DIAGNOSIS — M5442 Lumbago with sciatica, left side: Secondary | ICD-10-CM | POA: Diagnosis not present

## 2022-04-07 DIAGNOSIS — M546 Pain in thoracic spine: Secondary | ICD-10-CM | POA: Diagnosis not present

## 2022-04-07 DIAGNOSIS — M542 Cervicalgia: Secondary | ICD-10-CM | POA: Diagnosis not present

## 2022-04-18 DIAGNOSIS — M542 Cervicalgia: Secondary | ICD-10-CM | POA: Diagnosis not present

## 2022-04-18 DIAGNOSIS — M9901 Segmental and somatic dysfunction of cervical region: Secondary | ICD-10-CM | POA: Diagnosis not present

## 2022-04-18 DIAGNOSIS — M546 Pain in thoracic spine: Secondary | ICD-10-CM | POA: Diagnosis not present

## 2022-04-18 DIAGNOSIS — M5442 Lumbago with sciatica, left side: Secondary | ICD-10-CM | POA: Diagnosis not present

## 2022-05-10 DIAGNOSIS — R051 Acute cough: Secondary | ICD-10-CM | POA: Diagnosis not present

## 2022-05-10 DIAGNOSIS — Z6829 Body mass index (BMI) 29.0-29.9, adult: Secondary | ICD-10-CM | POA: Diagnosis not present

## 2022-05-10 DIAGNOSIS — J029 Acute pharyngitis, unspecified: Secondary | ICD-10-CM | POA: Diagnosis not present

## 2022-10-17 DIAGNOSIS — Z6830 Body mass index (BMI) 30.0-30.9, adult: Secondary | ICD-10-CM | POA: Diagnosis not present

## 2022-10-17 DIAGNOSIS — H10021 Other mucopurulent conjunctivitis, right eye: Secondary | ICD-10-CM | POA: Diagnosis not present

## 2022-10-19 DIAGNOSIS — M546 Pain in thoracic spine: Secondary | ICD-10-CM | POA: Diagnosis not present

## 2022-10-19 DIAGNOSIS — M9901 Segmental and somatic dysfunction of cervical region: Secondary | ICD-10-CM | POA: Diagnosis not present

## 2022-10-19 DIAGNOSIS — M542 Cervicalgia: Secondary | ICD-10-CM | POA: Diagnosis not present

## 2022-10-19 DIAGNOSIS — M5442 Lumbago with sciatica, left side: Secondary | ICD-10-CM | POA: Diagnosis not present

## 2022-11-02 DIAGNOSIS — M9901 Segmental and somatic dysfunction of cervical region: Secondary | ICD-10-CM | POA: Diagnosis not present

## 2022-11-02 DIAGNOSIS — M542 Cervicalgia: Secondary | ICD-10-CM | POA: Diagnosis not present

## 2022-11-02 DIAGNOSIS — M546 Pain in thoracic spine: Secondary | ICD-10-CM | POA: Diagnosis not present

## 2022-11-02 DIAGNOSIS — M5442 Lumbago with sciatica, left side: Secondary | ICD-10-CM | POA: Diagnosis not present

## 2023-02-25 DIAGNOSIS — B354 Tinea corporis: Secondary | ICD-10-CM | POA: Diagnosis not present

## 2023-02-25 DIAGNOSIS — Z6830 Body mass index (BMI) 30.0-30.9, adult: Secondary | ICD-10-CM | POA: Diagnosis not present

## 2023-03-11 DIAGNOSIS — L400 Psoriasis vulgaris: Secondary | ICD-10-CM | POA: Diagnosis not present

## 2023-03-11 DIAGNOSIS — Z6830 Body mass index (BMI) 30.0-30.9, adult: Secondary | ICD-10-CM | POA: Diagnosis not present

## 2023-03-11 DIAGNOSIS — L219 Seborrheic dermatitis, unspecified: Secondary | ICD-10-CM | POA: Diagnosis not present

## 2023-06-12 DIAGNOSIS — M546 Pain in thoracic spine: Secondary | ICD-10-CM | POA: Diagnosis not present

## 2023-06-12 DIAGNOSIS — M9901 Segmental and somatic dysfunction of cervical region: Secondary | ICD-10-CM | POA: Diagnosis not present

## 2023-06-12 DIAGNOSIS — M542 Cervicalgia: Secondary | ICD-10-CM | POA: Diagnosis not present

## 2023-06-12 DIAGNOSIS — M5442 Lumbago with sciatica, left side: Secondary | ICD-10-CM | POA: Diagnosis not present

## 2023-07-03 DIAGNOSIS — M9901 Segmental and somatic dysfunction of cervical region: Secondary | ICD-10-CM | POA: Diagnosis not present

## 2023-07-03 DIAGNOSIS — M546 Pain in thoracic spine: Secondary | ICD-10-CM | POA: Diagnosis not present

## 2023-07-03 DIAGNOSIS — M5442 Lumbago with sciatica, left side: Secondary | ICD-10-CM | POA: Diagnosis not present

## 2023-07-03 DIAGNOSIS — M542 Cervicalgia: Secondary | ICD-10-CM | POA: Diagnosis not present

## 2023-07-17 DIAGNOSIS — M5442 Lumbago with sciatica, left side: Secondary | ICD-10-CM | POA: Diagnosis not present

## 2023-07-17 DIAGNOSIS — M542 Cervicalgia: Secondary | ICD-10-CM | POA: Diagnosis not present

## 2023-07-17 DIAGNOSIS — M546 Pain in thoracic spine: Secondary | ICD-10-CM | POA: Diagnosis not present

## 2023-07-17 DIAGNOSIS — M9901 Segmental and somatic dysfunction of cervical region: Secondary | ICD-10-CM | POA: Diagnosis not present

## 2023-11-20 ENCOUNTER — Encounter: Payer: Self-pay | Admitting: Family Medicine

## 2023-11-20 ENCOUNTER — Ambulatory Visit (INDEPENDENT_AMBULATORY_CARE_PROVIDER_SITE_OTHER): Admitting: Family Medicine

## 2023-11-20 VITALS — BP 130/92 | HR 95 | Temp 98.3°F | Ht 68.25 in | Wt 187.6 lb

## 2023-11-20 DIAGNOSIS — Z114 Encounter for screening for human immunodeficiency virus [HIV]: Secondary | ICD-10-CM | POA: Diagnosis not present

## 2023-11-20 DIAGNOSIS — Z0000 Encounter for general adult medical examination without abnormal findings: Secondary | ICD-10-CM

## 2023-11-20 DIAGNOSIS — Z1159 Encounter for screening for other viral diseases: Secondary | ICD-10-CM | POA: Diagnosis not present

## 2023-11-20 DIAGNOSIS — E663 Overweight: Secondary | ICD-10-CM | POA: Diagnosis not present

## 2023-11-20 DIAGNOSIS — Z7689 Persons encountering health services in other specified circumstances: Secondary | ICD-10-CM

## 2023-11-20 NOTE — Progress Notes (Signed)
 New Patient Office Visit  Subjective   Patient ID: Natalie Hardin, female    DOB: 1991-02-17  Age: 33 y.o. MRN: [id number]  CC:  Chief Complaint  Patient presents with   Establish Care    Pt states no pcp since a kid. Does acupuncture with [facility] with [facility].     HPI Natalie Hardin presents to establish care with new provider.   Previous primary primary care provider: None   Specialist: [facility] with [facility]  [facility] with Dr. Alona Arrow   No concerns.  Diet: General with more healthy options and less fried Exercise: Started home exercising this week with stretching and weights.  Receives regular dental care and ophthalmology exam within the last year.   Outpatient Encounter Medications as of 11/20/2023  Medication Sig   levonorgestrel (MIRENA) 20 MCG/24HR IUD by Intrauterine route.   Psyllium (FT FIBER SUPPLEMENT PO) Take by mouth.   [DISCONTINUED] acetaminophen (TYLENOL) 325 MG suppository Place rectally. (Patient not taking: Reported on 11/20/2023)   [DISCONTINUED] ALPRAZolam  (XANAX ) 0.5 MG tablet Take 1 tablet (0.5 mg total) by mouth 2 (two) times daily as needed for anxiety. Please take one pill 30 minutes prior to scan. Take an additional one if needed. This medication is only for the test. Please do not drive while taking this medication, it will cause drowsiness or dizzy spells. Please arrange for a driver.   [DISCONTINUED] ibuprofen (ADVIL,MOTRIN) 600 MG tablet Take by mouth. (Patient not taking: Reported on 11/20/2023)   No facility-administered encounter medications on file as of 11/20/2023.    Past Medical History:  Diagnosis Date   Syncopal episodes     Past Surgical History:  Procedure Laterality Date   CESAREAN SECTION     x2    Family History  Problem Relation Age of Onset   Cancer Mother        Skin   Alcohol abuse Father    Hypotension Father    Cancer  Maternal Grandmother        Small Cell   Cancer Maternal Grandfather        Small Cancer    Social History   Socioeconomic History   Marital status: Married    Spouse name: Not on file   Number of children: 2   Years of education: Not on file   Highest education level: Bachelor's degree (e.g., BA, AB, BS)  Occupational History   Not on file  Tobacco Use   Smoking status: Never   Smokeless tobacco: Never  Vaping Use   Vaping status: Never Used  Substance and Sexual Activity   Alcohol use: No   Drug use: No   Sexual activity: Yes    Birth control/protection: Inserts  Other Topics Concern   Not on file  Social History Narrative   Not on file   Social Drivers of Health   Financial Resource Strain: Low Risk  (11/20/2023)   Overall Financial Resource Strain (CARDIA)    Difficulty of Paying Living Expenses: Not very hard  Food Insecurity: No Food Insecurity (11/20/2023)   Hunger Vital Sign    Worried About Running Out of Food in the Last Year: Never true    Ran Out of Food in the Last Year: Never true  Transportation Needs: No Transportation Needs (11/20/2023)   PRAPARE - Administrator, Civil Service (Medical): No    Lack of Transportation (Non-Medical): No  Physical Activity: Sufficiently  Active (11/20/2023)   Exercise Vital Sign    Days of Exercise per Week: 7 days    Minutes of Exercise per Session: 30 min  Stress: No Stress Concern Present (11/20/2023)   [employer] of Occupational Health - Occupational Stress Questionnaire    Feeling of Stress: Only a little  Social Connections: Socially Integrated (11/20/2023)   Social Connection and Isolation Panel    Frequency of Communication with Friends and Family: Twice a week    Frequency of Social Gatherings with Friends and Family: Once a week    Attends Religious Services: More than 4 times per year    Active Member of [employer] or Organizations: Yes    Attends Engineer, structural: More than 4 times  per year    Marital Status: Married  Catering manager Violence: Not At Risk (11/20/2023)   Humiliation, Afraid, Rape, and Kick questionnaire    Fear of Current or Ex-Partner: No    Emotionally Abused: No    Physically Abused: No    Sexually Abused: No    ROS See HPI above    Objective  BP (!) 130/92   Pulse 95   Temp 98.3 F (36.8 C)   Ht 5' 8.25 (1.734 m)   Wt 187 lb 9.6 oz (85.1 kg)   SpO2 98%   BMI 28.32 kg/m   Physical Exam Vitals reviewed.  Constitutional:      General: She is not in acute distress.    Appearance: Normal appearance. She is overweight. She is not ill-appearing or toxic-appearing.  HENT:     Head: Normocephalic and atraumatic.     Right Ear: Tympanic membrane, ear canal and external ear normal. There is no impacted cerumen.     Left Ear: Tympanic membrane, ear canal and external ear normal. There is no impacted cerumen.     Nose:     Right Sinus: No maxillary sinus tenderness or frontal sinus tenderness.     Left Sinus: No maxillary sinus tenderness or frontal sinus tenderness.     Mouth/Throat:     Mouth: Mucous membranes are moist.     Pharynx: Oropharynx is clear. No oropharyngeal exudate or posterior oropharyngeal erythema.   Eyes:     General:        Right eye: No discharge.        Left eye: No discharge.     Conjunctiva/sclera: Conjunctivae normal.     Pupils: Pupils are equal, round, and reactive to light.   Neck:     Thyroid: No thyromegaly.   Cardiovascular:     Rate and Rhythm: Normal rate and regular rhythm.     Pulses:          Posterior tibial pulses are 2+ on the right side and 2+ on the left side.     Heart sounds: Normal heart sounds. No murmur heard.    No friction rub. No gallop.  Pulmonary:     Effort: Pulmonary effort is normal. No respiratory distress.     Breath sounds: Normal breath sounds.  Abdominal:     General: Abdomen is flat. Bowel sounds are normal. There is no distension.     Palpations: Abdomen is soft.  There is no mass.     Tenderness: There is no abdominal tenderness. There is no right CVA tenderness or left CVA tenderness.   Musculoskeletal:        General: Normal range of motion.     Cervical back: Normal range of motion.  Right lower leg: No edema.     Left lower leg: No edema.  Lymphadenopathy:     Head:     Right side of head: No submental or submandibular adenopathy.     Left side of head: No submental or submandibular adenopathy.     Cervical: No cervical adenopathy.   Skin:    General: Skin is warm and dry.   Neurological:     General: No focal deficit present.     Mental Status: She is alert and oriented to person, place, and time. Mental status is at baseline.     Motor: No weakness.     Gait: Gait normal.   Psychiatric:        Mood and Affect: Mood normal.        Behavior: Behavior normal.        Thought Content: Thought content normal.        Judgment: Judgment normal.       Assessment & Plan:  Annual physical exam  Overweight -     CBC with Differential/Platelet -     Comprehensive metabolic panel with GFR -     Hemoglobin A1c -     Lipid panel -     TSH  Encounter for screening for HIV -     HIV Antibody (routine testing w rflx)  Need for hepatitis C screening test -     Hepatitis C antibody  Encounter to establish care   1.Review health maintenance: -Covid booster: Declines recent ones  -HIV and Hep C: Order  2.Physical exam completed. 3.Ordered labs (CBC, CMP, A1c, Lipid, and TSH) based on BMI-overweight.  4.Continue a healthy diet and regular exercise. Return in about 1 year (around 11/19/2024) for physical.   Ronell Duffus, NP

## 2023-11-20 NOTE — Patient Instructions (Signed)
-  It was nice to meet you and look forward to taking care of you. -Physical exam completed. -Ordered labs. Office will call with lab results and will see them on MyChart. -Continue a healthy diet and regular exercise. -Follow up in 1 year for a physical.

## 2023-11-21 LAB — COMPREHENSIVE METABOLIC PANEL WITH GFR
AG Ratio: 1.7 (calc) (ref 1.0–2.5)
ALT: 22 U/L (ref 6–29)
AST: 17 U/L (ref 10–30)
Albumin: 4.5 g/dL (ref 3.6–5.1)
Alkaline phosphatase (APISO): 51 U/L (ref 31–125)
BUN: 18 mg/dL (ref 7–25)
CO2: 26 mmol/L (ref 20–32)
Calcium: 9.3 mg/dL (ref 8.6–10.2)
Chloride: 105 mmol/L (ref 98–110)
Creat: 0.92 mg/dL (ref 0.50–0.97)
Globulin: 2.7 g/dL (ref 1.9–3.7)
Glucose, Bld: 89 mg/dL (ref 65–99)
Potassium: 4.2 mmol/L (ref 3.5–5.3)
Sodium: 140 mmol/L (ref 135–146)
Total Bilirubin: 0.5 mg/dL (ref 0.2–1.2)
Total Protein: 7.2 g/dL (ref 6.1–8.1)
eGFR: 84 mL/min/{1.73_m2} (ref >=60)

## 2023-11-21 LAB — CBC WITH DIFFERENTIAL/PLATELET
Absolute Lymphocytes: 2122 {cells}/uL (ref 850–3900)
Absolute Monocytes: 328 {cells}/uL (ref 200–950)
Basophils Absolute: 70 {cells}/uL (ref 0–200)
Basophils Relative: 0.9 %
Eosinophils Absolute: 140 {cells}/uL (ref 15–500)
Eosinophils Relative: 1.8 %
HCT: 42.5 % (ref 35.0–45.0)
Hemoglobin: 13.7 g/dL (ref 11.7–15.5)
MCH: 29.9 pg (ref 27.0–33.0)
MCHC: 32.2 g/dL (ref 32.0–36.0)
MCV: 92.8 fL (ref 80.0–100.0)
MPV: 10.3 fL (ref 7.5–12.5)
Monocytes Relative: 4.2 %
Neutro Abs: 5140 {cells}/uL (ref 1500–7800)
Neutrophils Relative %: 65.9 %
Platelets: 338 10*3/uL (ref 140–400)
RBC: 4.58 10*6/uL (ref 3.80–5.10)
RDW: 13.1 % (ref 11.0–15.0)
Total Lymphocyte: 27.2 %
WBC: 7.8 10*3/uL (ref 3.8–10.8)

## 2023-11-21 LAB — LIPID PANEL
Cholesterol: 241 mg/dL — ABNORMAL HIGH (ref <200)
HDL: 49 mg/dL — ABNORMAL LOW (ref >=50)
LDL Cholesterol (Calc): 175 mg/dL — ABNORMAL HIGH
Non-HDL Cholesterol (Calc): 192 mg/dL — ABNORMAL HIGH (ref <130)
Total CHOL/HDL Ratio: 4.9 (calc) (ref <5.0)
Triglycerides: 73 mg/dL (ref <150)

## 2023-11-21 LAB — HEMOGLOBIN A1C
Hgb A1c MFr Bld: 5 % (ref <5.7)
Mean Plasma Glucose: 97 mg/dL
eAG (mmol/L): 5.4 mmol/L

## 2023-11-21 LAB — HEPATITIS C ANTIBODY: Hepatitis C Ab: NONREACTIVE

## 2023-11-21 LAB — TSH: TSH: 1.26 m[IU]/L

## 2023-11-21 LAB — HIV ANTIBODY (ROUTINE TESTING W REFLEX): HIV 1&2 Ab, 4th Generation: NONREACTIVE

## 2023-11-22 ENCOUNTER — Ambulatory Visit: Payer: Self-pay | Admitting: Family Medicine

## 2023-12-27 DIAGNOSIS — Z01419 Encounter for gynecological examination (general) (routine) without abnormal findings: Secondary | ICD-10-CM | POA: Diagnosis not present

## 2024-02-04 ENCOUNTER — Inpatient Hospital Stay (HOSPITAL_COMMUNITY)
Admission: RE | Admit: 2024-02-04 | Discharge: 2024-02-04 | Discharge status: Home / Self Care | Payer: Self-pay | Source: Ambulatory Visit | Attending: Emergency Medicine | Admitting: Emergency Medicine

## 2024-02-04 ENCOUNTER — Encounter (HOSPITAL_COMMUNITY): Payer: Self-pay

## 2024-02-04 VITALS — BP 117/73 | HR 85 | Temp 98.6°F | Resp 17

## 2024-02-04 DIAGNOSIS — A6920 Lyme disease, unspecified: Secondary | ICD-10-CM

## 2024-02-04 DIAGNOSIS — W57XXXA Bitten or stung by nonvenomous insect and other nonvenomous arthropods, initial encounter: Secondary | ICD-10-CM

## 2024-02-04 DIAGNOSIS — S70362A Insect bite (nonvenomous), left thigh, initial encounter: Secondary | ICD-10-CM

## 2024-02-04 MED ORDER — DOXYCYCLINE HYCLATE 100 MG PO CAPS: 100.0000 mg | ORAL_CAPSULE | Freq: Two times a day (BID) | ORAL | 0 refills | Status: AC

## 2024-02-04 NOTE — ED Triage Notes (Signed)
 Pt reports was in the woods couple weeks ago. Reports believe got bit by something. Reports noticed red ring around the bit on lateral, upper, posterior left leg. Reports will have times where itches. When itches will put cold on area as well as cortisone.

## 2024-02-04 NOTE — ED Provider Notes (Signed)
 [facility]    CSN: [id number] Arrival date & time: 02/04/24  1247      History   Chief Complaint Chief Complaint  Patient presents with   appt 1    HPI Natalie Hardin is a 33 y.o. female.   Patient presents for concerns of bug bite that occurred couple weeks ago on her left upper leg.  Patient states that she was in the woods a couple weeks ago and got poison ivy and was bit by a few insects but unsure exactly which insect.  Patient states that over the last few days she noticed a red ring around the bite site on her lateral upper left leg.  Patient does report some itching to this area.  Patient states that she has applied cool compresses and cortisone to the area with minimal relief.  Denies any fever, body aches, chills, and weakness.  Denies any drainage from the area.  The history is provided by the patient and medical records.    Past Medical History:  Diagnosis Date   Syncopal episodes     There are no active problems to display for this patient.   Past Surgical History:  Procedure Laterality Date   CESAREAN SECTION     x2    OB History   No obstetric history on file.      Home Medications    Prior to Admission medications   Medication Sig Start Date End Date Taking? Authorizing Provider  doxycycline  (VIBRAMYCIN ) 100 MG capsule Take 1 capsule (100 mg total) by mouth 2 (two) times daily for 14 days. 02/04/24 02/18/24 Yes Johnie Rumaldo LABOR, NP  levonorgestrel (MIRENA) 20 MCG/24HR IUD by Intrauterine route. 08/16/16 11/20/23  [provider]  Psyllium (FT FIBER SUPPLEMENT PO) Take by mouth.    [provider]    Family History Family History  Problem Relation Age of Onset   Cancer Mother        Skin   Alcohol abuse Father    Hypotension Father    Cancer Maternal Grandmother        Small Cell   Cancer Maternal Grandfather        Small Cancer    Social History Social History   Tobacco Use   Smoking status: Never    Smokeless tobacco: Never  Vaping Use   Vaping status: Never Used  Substance Use Topics   Alcohol use: No   Drug use: No     Allergies   Patient has no known allergies.   Review of Systems Review of Systems  Per HPI  Physical Exam Triage Vital Signs ED Triage Vitals  Encounter Vitals Group     BP 02/04/24 1305 117/73     Girls Systolic BP Percentile --      Girls Diastolic BP Percentile --      Boys Systolic BP Percentile --      Boys Diastolic BP Percentile --      Pulse Rate 02/04/24 1305 85     Resp 02/04/24 1305 17     Temp 02/04/24 1305 98.6 F (37 C)     Temp Source 02/04/24 1305 Oral     SpO2 02/04/24 1305 98 %     Weight --      Height --      Head Circumference --      Peak Flow --      Pain Score 02/04/24 1304 0     Pain Loc --  Pain Education --      Exclude from Growth Chart --    No data found.  Updated Vital Signs BP 117/73 (BP Location: Left Arm)   Pulse 85   Temp 98.6 F (37 C) (Oral)   Resp 17   SpO2 98%   Visual Acuity Right Eye Distance:   Left Eye Distance:   Bilateral Distance:    Right Eye Near:   Left Eye Near:    Bilateral Near:     Physical Exam Vitals and nursing note reviewed.  Constitutional:      General: She is awake. She is not in acute distress.    Appearance: Normal appearance. She is well-developed and well-groomed. She is not ill-appearing.  Skin:    General: Skin is warm and dry.     Findings: Erythema and wound present.     Comments: Bug bite sized scabbed over wound with surrounding raised erythematous ring.  Neurological:     Mental Status: She is alert.  Psychiatric:        Behavior: Behavior is cooperative.      UC Treatments / Results  Labs (all labs ordered are listed, but only abnormal results are displayed) Labs Reviewed - No data to display  EKG   Radiology No results found.  Procedures Procedures (including critical care time)  Medications Ordered in UC Medications - No data  to display  Initial Impression / Assessment and Plan / UC Course  I have reviewed the triage vital signs and the nursing notes.  Pertinent labs & imaging results that were available during my care of the patient were reviewed by me and considered in my medical decision making (see chart for details).     Patient is overall well-appearing.  Vitals are stable.  Exam findings consistent with erythema migrans.  Prescribed doxycycline .  Discussed follow-up and return precautions. Final Clinical Impressions(s) / UC Diagnoses   Final diagnoses:  Tick bite of left thigh, initial encounter  Erythema migrans (Lyme disease)     Discharge Instructions      Start taking doxycycline  twice daily for 14 days. If you notice spreading of redness, worsening swelling, increased pain, develop fever or weakness please seek immediate medical treatment in the emergency department.   ED Prescriptions     Medication Sig Dispense Auth. Provider   doxycycline  (VIBRAMYCIN ) 100 MG capsule Take 1 capsule (100 mg total) by mouth 2 (two) times daily for 14 days. 28 capsule Johnie Flaming A, NP      PDMP not reviewed this encounter.   Johnie Flaming A, NP 02/04/24 1336

## 2024-02-04 NOTE — Discharge Instructions (Addendum)
 Start taking doxycycline  twice daily for 14 days. If you notice spreading of redness, worsening swelling, increased pain, develop fever or weakness please seek immediate medical treatment in the emergency department.

## 2024-02-14 DIAGNOSIS — Z30433 Encounter for removal and reinsertion of intrauterine contraceptive device: Secondary | ICD-10-CM | POA: Diagnosis not present

## 2024-03-28 DIAGNOSIS — Z30431 Encounter for routine checking of intrauterine contraceptive device: Secondary | ICD-10-CM | POA: Diagnosis not present

## 2024-11-20 ENCOUNTER — Encounter: Admitting: Family Medicine

## 2024-11-27 ENCOUNTER — Encounter: Admitting: Family Medicine
